# Patient Record
Sex: Male | Born: 1970 | Race: White | Hispanic: No | Marital: Single | Smoking: Current every day smoker
Health system: Southern US, Community
[De-identification: ages and names within clinical notes are randomized; demographics above are authoritative.]

## PROBLEM LIST (undated history)

## (undated) DIAGNOSIS — F1010 Alcohol abuse, uncomplicated: Secondary | ICD-10-CM

## (undated) DIAGNOSIS — G8929 Other chronic pain: Secondary | ICD-10-CM

## (undated) DIAGNOSIS — F32A Depression, unspecified: Secondary | ICD-10-CM

## (undated) DIAGNOSIS — G4700 Insomnia, unspecified: Secondary | ICD-10-CM

## (undated) DIAGNOSIS — F419 Anxiety disorder, unspecified: Secondary | ICD-10-CM

## (undated) DIAGNOSIS — F329 Major depressive disorder, single episode, unspecified: Secondary | ICD-10-CM

## (undated) DIAGNOSIS — F909 Attention-deficit hyperactivity disorder, unspecified type: Secondary | ICD-10-CM

## (undated) HISTORY — DX: Alcohol abuse, uncomplicated: F10.10

## (undated) HISTORY — DX: Insomnia, unspecified: G47.00

## (undated) HISTORY — DX: Anxiety disorder, unspecified: F41.9

## (undated) HISTORY — PX: OTHER SURGICAL HISTORY: SHX169

## (undated) HISTORY — DX: Other chronic pain: G89.29

---

## 2005-05-21 ENCOUNTER — Emergency Department (HOSPITAL_COMMUNITY): Admission: EM | Admit: 2005-05-21 | Discharge: 2005-05-21 | Payer: Self-pay | Admitting: Emergency Medicine

## 2007-03-30 ENCOUNTER — Ambulatory Visit: Payer: Self-pay | Admitting: Orthopedic Surgery

## 2007-03-30 ENCOUNTER — Emergency Department (HOSPITAL_COMMUNITY): Admission: EM | Admit: 2007-03-30 | Discharge: 2007-03-30 | Payer: Self-pay | Admitting: Emergency Medicine

## 2007-04-05 ENCOUNTER — Ambulatory Visit: Payer: Self-pay | Admitting: Orthopedic Surgery

## 2007-04-05 ENCOUNTER — Emergency Department (HOSPITAL_COMMUNITY): Admission: EM | Admit: 2007-04-05 | Discharge: 2007-04-05 | Payer: Self-pay | Admitting: Emergency Medicine

## 2007-04-12 ENCOUNTER — Encounter (INDEPENDENT_AMBULATORY_CARE_PROVIDER_SITE_OTHER): Payer: Self-pay | Admitting: *Deleted

## 2007-10-09 ENCOUNTER — Emergency Department (HOSPITAL_COMMUNITY): Admission: EM | Admit: 2007-10-09 | Discharge: 2007-10-09 | Payer: Self-pay | Admitting: Emergency Medicine

## 2010-04-15 ENCOUNTER — Emergency Department (HOSPITAL_COMMUNITY)
Admission: EM | Admit: 2010-04-15 | Discharge: 2010-04-15 | Payer: Self-pay | Source: Home / Self Care | Admitting: Emergency Medicine

## 2010-05-19 ENCOUNTER — Emergency Department (HOSPITAL_COMMUNITY)
Admission: EM | Admit: 2010-05-19 | Discharge: 2010-05-19 | Payer: Self-pay | Source: Home / Self Care | Admitting: Emergency Medicine

## 2010-06-06 ENCOUNTER — Ambulatory Visit (HOSPITAL_COMMUNITY)
Admission: RE | Admit: 2010-06-06 | Discharge: 2010-06-06 | Payer: Self-pay | Source: Home / Self Care | Attending: Otolaryngology | Admitting: Otolaryngology

## 2010-09-23 NOTE — Consult Note (Signed)
NAME:  John Bradley, PERREIRA NO.:  [id number]   MEDICAL RECORD NO.:  [id number]          PATIENT TYPE:  EMS   LOCATION:  ED                            FACILITY:  [facility]   PHYSICIAN:  Vickki Hearing, M.D.DATE OF BIRTH:  14-Feb-1971   DATE OF CONSULTATION:  03/30/2007  DATE OF DISCHARGE:  03/30/2007                                 CONSULTATION   This is a consultation in the emergency room requested by Dr. Margretta Ditty.   REASON FOR CONSULTATION:  Open fracture left index finger.   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  He was wearing some gloves.  The glove was  sucked into the wood cutter and drew his finger into the wood cutter,  and he presented with an open fracture and laceration with flexion  deformity of the digit at the middle phalanx.  He was evaluated in the  emergency room.  He has had a tetanus shot within the last 5 years.  His  x-ray shows a middle phalanx fracture at its proximal metastasis.  It is  transverse through and through.   Reportedly, he was neurologically and vascularly intact.  He had  flexion, weak extension.   HE DENIES ANY ALLERGIES.   He denies being on any medications.  He denies any major medical  problems.   His vital signs were stable.  His appearance was normal.  His vascular  examination of the digit showed good capillary refill, color and  temperature.  Sensory exam showed just some mild decreased sensation  that seemed to be global at the distal tip of the finger.  The mental  status was normal.  The patient was calm.  The patient had been given  some medications so his mood was flat.  The musculoskeletal exam showed  that this injury was isolated to the index finger.  There was a 85%  circumferential laceration which was down to the extensor tendon but not  through the extensor tendon.  There was crepitus at the fracture site.  The digit was obviously tender.  He did  have flexion.  He had weak  extension.   Radiographs show a metaphyseal fracture of the middle phalanx.   The patient was given a digital block with 1% plain lidocaine.  He was  then treated with irrigation, a gram of Ancef, a loose wound closure and  splint.   He is discharged on clindamycin 300 mg t.i.d. and Lorcet 10/650 #40 to  take one q.4 p.r.n. for pain.  Followup scheduled for Tuesday, November  25.      Vickki Hearing, M.D.  Electronically Signed     SEH/MEDQ  D:  03/30/2007  T:  03/31/2007  Job:  [id number]

## 2010-12-30 ENCOUNTER — Emergency Department (HOSPITAL_COMMUNITY)
Admission: EM | Admit: 2010-12-30 | Discharge: 2010-12-30 | Disposition: A | Discharge status: Home / Self Care | Payer: Medicaid Other | Attending: Emergency Medicine | Admitting: Emergency Medicine

## 2010-12-30 ENCOUNTER — Encounter: Payer: Self-pay | Admitting: *Deleted

## 2010-12-30 DIAGNOSIS — F329 Major depressive disorder, single episode, unspecified: Secondary | ICD-10-CM | POA: Insufficient documentation

## 2010-12-30 DIAGNOSIS — F3289 Other specified depressive episodes: Secondary | ICD-10-CM | POA: Insufficient documentation

## 2010-12-30 DIAGNOSIS — Z760 Encounter for issue of repeat prescription: Secondary | ICD-10-CM | POA: Insufficient documentation

## 2010-12-30 DIAGNOSIS — F17200 Nicotine dependence, unspecified, uncomplicated: Secondary | ICD-10-CM | POA: Insufficient documentation

## 2010-12-30 DIAGNOSIS — F909 Attention-deficit hyperactivity disorder, unspecified type: Secondary | ICD-10-CM | POA: Insufficient documentation

## 2010-12-30 HISTORY — DX: Depression, unspecified: F32.A

## 2010-12-30 HISTORY — DX: Major depressive disorder, single episode, unspecified: F32.9

## 2010-12-30 HISTORY — DX: Attention-deficit hyperactivity disorder, unspecified type: F90.9

## 2010-12-30 NOTE — ED Notes (Signed)
Patient ran out of med Celexa and wants a refill, wife states that they just recently moved here and that pt does not have a doctor yet

## 2010-12-30 NOTE — ED Provider Notes (Signed)
History     CSN: [id number] Arrival date & time: 12/30/2010  9:28 PM  Chief Complaint  Patient presents with  . Illegal value: [    ran out of med-celexa   HPI Comments: Seen 2210. Pateint here with request for refill for his celexa. He is here visiting family and is out of his medicine. He wants his celexa refilled.It is normally filled at [facility]. He has not called his PMD. I advised him to contact his PMD tomorrow and prescription can be filled at the local [facility].  The history is provided by the patient.    Past Medical History  Diagnosis Date  . Depression   . ADHD (attention deficit hyperactivity disorder)     History reviewed. No pertinent past surgical history.  History reviewed. No pertinent family history.  History  Substance Use Topics  . Smoking status: Current Everyday Smoker -- 0.5 packs/day    Types: Cigarettes  . Smokeless tobacco: Not on file  . Alcohol Use: No      Review of Systems  All other systems reviewed and are negative.    Physical Exam  BP 113/67  Pulse 78  Temp(Src) 98 F (36.7 C) (Oral)  Resp 18  Ht 6\' 2"  (1.88 m)  Wt 185 lb (83.915 kg)  BMI 23.75 kg/m2  SpO2 97%  Physical Exam  Nursing note and vitals reviewed. Constitutional: He is oriented to person, place, and time. He appears well-developed and well-nourished.  HENT:  Head: Normocephalic.  Right Ear: External ear normal.  Left Ear: External ear normal.  Eyes: EOM are normal. Pupils are equal, round, and reactive to light.  Neck: Neck supple.  Cardiovascular: Normal rate, normal heart sounds and intact distal pulses.   Pulmonary/Chest: Effort normal and breath sounds normal.  Abdominal: Soft.  Musculoskeletal: Normal range of motion.  Neurological: He is alert and oriented to person, place, and time.  Skin: Skin is warm and dry.    ED Course  Procedures  MDM Patient visiting and ran out of celexa. He has access to his PMD but had not contacted him. Prescription  was not renewed. He was advised to follow up with his doctor.Reviewed nurse notes and vital signs.      Nicoletta Dress. Colon Branch, MD 12/30/10 2340

## 2011-01-20 ENCOUNTER — Ambulatory Visit: Payer: Medicaid Other | Admitting: Family Medicine

## 2011-01-23 ENCOUNTER — Encounter: Payer: Self-pay | Admitting: Family Medicine

## 2011-01-23 ENCOUNTER — Ambulatory Visit (INDEPENDENT_AMBULATORY_CARE_PROVIDER_SITE_OTHER): Payer: Medicare Other | Admitting: Family Medicine

## 2011-01-23 VITALS — BP 114/74 | HR 77 | Resp 16 | Ht 74.0 in | Wt 203.8 lb

## 2011-01-23 DIAGNOSIS — F1021 Alcohol dependence, in remission: Secondary | ICD-10-CM

## 2011-01-23 DIAGNOSIS — F3289 Other specified depressive episodes: Secondary | ICD-10-CM

## 2011-01-23 DIAGNOSIS — G8929 Other chronic pain: Secondary | ICD-10-CM

## 2011-01-23 DIAGNOSIS — M549 Dorsalgia, unspecified: Secondary | ICD-10-CM

## 2011-01-23 DIAGNOSIS — F32A Depression, unspecified: Secondary | ICD-10-CM | POA: Insufficient documentation

## 2011-01-23 DIAGNOSIS — F329 Major depressive disorder, single episode, unspecified: Secondary | ICD-10-CM

## 2011-01-23 DIAGNOSIS — M542 Cervicalgia: Secondary | ICD-10-CM

## 2011-01-23 MED ORDER — CITALOPRAM HYDROBROMIDE 40 MG PO TABS: 60.0000 mg | ORAL_TABLET | Freq: Every day | ORAL | Status: DC

## 2011-01-23 MED ORDER — RANITIDINE HCL 150 MG PO TABS: 150.0000 mg | ORAL_TABLET | Freq: Every day | ORAL | Status: DC

## 2011-01-23 MED ORDER — MELOXICAM 7.5 MG PO TABS: 7.5000 mg | ORAL_TABLET | Freq: Every day | ORAL | Status: DC

## 2011-01-23 MED ORDER — CYCLOBENZAPRINE HCL 10 MG PO TABS: 10.0000 mg | ORAL_TABLET | Freq: Three times a day (TID) | ORAL | Status: DC | PRN

## 2011-01-23 NOTE — Assessment & Plan Note (Signed)
Per above, obtain imaging, no red flags

## 2011-01-23 NOTE — Assessment & Plan Note (Signed)
His neck and back pain appeared chronic problems. I will obtain plain imaging. Advised I would not prescribe any narcotic medications as I do not have the culprit of this problem. Will start on muscle relaxants and anti-inflammatory daily

## 2011-01-23 NOTE — Patient Instructions (Signed)
For your back and neck get the x-rays done in the next 2 weeks  Start the meloxicam daily and take the muscle relaxant I will get records from your previous doctor Return in 4 weeks

## 2011-01-23 NOTE — Assessment & Plan Note (Signed)
Patient to continue Celexa, I will obtain medical records, I advised him I do not feel comfortable prescribing something for sleep at this time especially with other new medications

## 2011-01-23 NOTE — Assessment & Plan Note (Signed)
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 I am concerned with his mood disorder that this may not be the case
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
## 2011-02-12 ENCOUNTER — Ambulatory Visit (HOSPITAL_COMMUNITY)
Admission: RE | Admit: 2011-02-12 | Discharge: 2011-02-12 | Disposition: A | Discharge status: Home / Self Care | Payer: Medicaid Other | Source: Ambulatory Visit | Attending: Family Medicine | Admitting: Family Medicine

## 2011-02-12 DIAGNOSIS — M79609 Pain in unspecified limb: Secondary | ICD-10-CM | POA: Insufficient documentation

## 2011-02-12 DIAGNOSIS — M545 Low back pain, unspecified: Secondary | ICD-10-CM | POA: Insufficient documentation

## 2011-02-12 DIAGNOSIS — M51379 Other intervertebral disc degeneration, lumbosacral region without mention of lumbar back pain or lower extremity pain: Secondary | ICD-10-CM | POA: Insufficient documentation

## 2011-02-12 DIAGNOSIS — M5137 Other intervertebral disc degeneration, lumbosacral region: Secondary | ICD-10-CM | POA: Insufficient documentation

## 2011-02-12 DIAGNOSIS — G8929 Other chronic pain: Secondary | ICD-10-CM

## 2011-02-12 DIAGNOSIS — M542 Cervicalgia: Secondary | ICD-10-CM | POA: Insufficient documentation

## 2011-02-23 ENCOUNTER — Encounter: Payer: Self-pay | Admitting: Family Medicine

## 2011-02-23 ENCOUNTER — Ambulatory Visit (INDEPENDENT_AMBULATORY_CARE_PROVIDER_SITE_OTHER): Payer: Medicaid Other | Admitting: Family Medicine

## 2011-02-23 VITALS — BP 116/70 | HR 92 | Resp 16 | Wt 206.0 lb

## 2011-02-23 DIAGNOSIS — Z23 Encounter for immunization: Secondary | ICD-10-CM

## 2011-02-23 DIAGNOSIS — M542 Cervicalgia: Secondary | ICD-10-CM

## 2011-02-23 DIAGNOSIS — T300 Burn of unspecified body region, unspecified degree: Secondary | ICD-10-CM

## 2011-02-23 DIAGNOSIS — F32A Depression, unspecified: Secondary | ICD-10-CM

## 2011-02-23 DIAGNOSIS — F3289 Other specified depressive episodes: Secondary | ICD-10-CM

## 2011-02-23 DIAGNOSIS — F329 Major depressive disorder, single episode, unspecified: Secondary | ICD-10-CM

## 2011-02-23 DIAGNOSIS — X19XXXA Contact with other heat and hot substances, initial encounter: Secondary | ICD-10-CM

## 2011-02-23 DIAGNOSIS — M549 Dorsalgia, unspecified: Secondary | ICD-10-CM

## 2011-02-23 DIAGNOSIS — G8929 Other chronic pain: Secondary | ICD-10-CM

## 2011-02-23 DIAGNOSIS — T22019A Burn of unspecified degree of unspecified forearm, initial encounter: Secondary | ICD-10-CM

## 2011-02-23 MED ORDER — NICOTINE 14 MG/24HR TD PT24: 1.0000 | MEDICATED_PATCH | TRANSDERMAL | Status: DC

## 2011-02-23 MED ORDER — CITALOPRAM HYDROBROMIDE 40 MG PO TABS: 60.0000 mg | ORAL_TABLET | Freq: Every day | ORAL | Status: DC

## 2011-02-23 MED ORDER — CYCLOBENZAPRINE HCL 10 MG PO TABS: 10.0000 mg | ORAL_TABLET | Freq: Three times a day (TID) | ORAL | Status: AC | PRN

## 2011-02-23 MED ORDER — RANITIDINE HCL 150 MG PO TABS: 150.0000 mg | ORAL_TABLET | Freq: Every day | ORAL | Status: DC

## 2011-02-23 MED ORDER — SILVER SULFADIAZINE 1 % EX CREA: TOPICAL_CREAM | CUTANEOUS | Status: AC

## 2011-02-23 NOTE — Patient Instructions (Signed)
Use the cream for the burn, use twice a day- if you notice blistering or large amounts of skin peeling off, come back to be seen Continue the muscle relaxant as needed for your neck/back Continue the Celexa Try the nicotine patch, use 1 patch daily, do not smoke with the patch on F/u in 6 months for a physical-do not eat after midnight- give him a morning appt
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
## 2011-02-24 NOTE — Assessment & Plan Note (Signed)
Chronic in nature, fairly normal x-ray. No chronic narcotics needed

## 2011-02-24 NOTE — Assessment & Plan Note (Signed)
Only spasm noted, discussed no chronic narotics needed. Refilled flexeril

## 2011-02-24 NOTE — Assessment & Plan Note (Signed)
Appears stable today, no change in meds

## 2011-02-24 NOTE — Assessment & Plan Note (Signed)
Given red flags Silvadene to be used

## 2011-08-24 ENCOUNTER — Encounter: Payer: Medicaid Other | Admitting: Family Medicine

## 2011-08-24 ENCOUNTER — Encounter: Payer: Self-pay | Admitting: Family Medicine

## 2013-09-19 ENCOUNTER — Emergency Department (HOSPITAL_COMMUNITY): Payer: Medicaid Other

## 2013-09-19 ENCOUNTER — Emergency Department (HOSPITAL_COMMUNITY)
Admission: EM | Admit: 2013-09-19 | Discharge: 2013-09-19 | Disposition: A | Discharge status: Home / Self Care | Payer: Medicaid Other | Attending: Emergency Medicine | Admitting: Emergency Medicine

## 2013-09-19 ENCOUNTER — Encounter (HOSPITAL_COMMUNITY): Payer: Self-pay | Admitting: Emergency Medicine

## 2013-09-19 DIAGNOSIS — G8929 Other chronic pain: Secondary | ICD-10-CM | POA: Insufficient documentation

## 2013-09-19 DIAGNOSIS — Y92838 Other recreation area as the place of occurrence of the external cause: Secondary | ICD-10-CM

## 2013-09-19 DIAGNOSIS — F329 Major depressive disorder, single episode, unspecified: Secondary | ICD-10-CM | POA: Insufficient documentation

## 2013-09-19 DIAGNOSIS — S300XXA Contusion of lower back and pelvis, initial encounter: Secondary | ICD-10-CM

## 2013-09-19 DIAGNOSIS — F17200 Nicotine dependence, unspecified, uncomplicated: Secondary | ICD-10-CM | POA: Insufficient documentation

## 2013-09-19 DIAGNOSIS — F3289 Other specified depressive episodes: Secondary | ICD-10-CM | POA: Insufficient documentation

## 2013-09-19 DIAGNOSIS — Y9239 Other specified sports and athletic area as the place of occurrence of the external cause: Secondary | ICD-10-CM | POA: Insufficient documentation

## 2013-09-19 DIAGNOSIS — S20229A Contusion of unspecified back wall of thorax, initial encounter: Secondary | ICD-10-CM | POA: Insufficient documentation

## 2013-09-19 DIAGNOSIS — Z79899 Other long term (current) drug therapy: Secondary | ICD-10-CM | POA: Insufficient documentation

## 2013-09-19 DIAGNOSIS — Y9351 Activity, roller skating (inline) and skateboarding: Secondary | ICD-10-CM | POA: Insufficient documentation

## 2013-09-19 MED ORDER — TRAMADOL HCL 50 MG PO TABS: 50.0000 mg | ORAL_TABLET | Freq: Four times a day (QID) | ORAL | Status: DC | PRN

## 2013-09-19 NOTE — ED Notes (Signed)
Pt c/o tailbone pain since falling skating April 26th

## 2013-09-19 NOTE — Discharge Instructions (Signed)
Follow up with your md as needed °

## 2013-09-19 NOTE — ED Provider Notes (Signed)
CSN: [id number]     Arrival date & time 09/19/13  0019 History   First MD Initiated Contact with Patient 09/19/13 0157     Chief Complaint  Patient presents with  . Tailbone Pain     (Consider location/radiation/quality/duration/timing/severity/associated sxs/prior Treatment) Patient is a 43 y.o. male presenting with fall. The history is provided by the patient (pt fell skating and has pain in his buttocks).  Fall This is a new problem. The current episode started 2 days ago. The problem occurs constantly. The problem has not changed since onset.Pertinent negatives include no chest pain, no abdominal pain and no headaches. The symptoms are aggravated by walking. Nothing relieves the symptoms.    Past Medical History  Diagnosis Date  . Depression   . ADHD (attention deficit hyperactivity disorder)   . Chronic pain   . Insomnia   . Alcohol abuse    Past Surgical History  Procedure Laterality Date  . Left ear      tube   Family History  Problem Relation Age of Onset  . Diabetes Mother    History  Substance Use Topics  . Smoking status: Current Every Day Smoker -- 0.50 packs/day    Types: Cigarettes  . Smokeless tobacco: Not on file  . Alcohol Use: No    Review of Systems  Constitutional: Negative for appetite change and fatigue.  HENT: Negative for congestion, ear discharge and sinus pressure.   Eyes: Negative for discharge.  Respiratory: Negative for cough.   Cardiovascular: Negative for chest pain.  Gastrointestinal: Negative for abdominal pain and diarrhea.  Genitourinary: Negative for frequency and hematuria.  Musculoskeletal: Positive for back pain.  Skin: Negative for rash.  Neurological: Negative for seizures and headaches.  Psychiatric/Behavioral: Negative for hallucinations.      Allergies  Review of patient's allergies indicates no known allergies.  Home Medications   Prior to Admission medications   Medication Sig Start Date End Date Taking?  Authorizing Provider  citalopram (CELEXA) 40 MG tablet Take 1.5 tablets (60 mg total) by mouth daily. 02/23/11  Yes Salley ScarletKawanta F [place], MD  ranitidine (ZANTAC) 150 MG tablet Take 1 tablet (150 mg total) by mouth at bedtime. 02/23/11  Yes Salley ScarletKawanta F [place], MD  nicotine (NICODERM CQ - DOSED IN MG/24 HOURS) 14 mg/24hr patch Place 1 patch onto the skin daily. 02/23/11   Salley ScarletKawanta F , MD  traMADol (ULTRAM) 50 MG tablet Take 1 tablet (50 mg total) by mouth every 6 (six) hours as needed. 09/19/13   Benny LennertJoseph L Yuktha Kerchner, MD   BP 111/82  Pulse 74  Temp(Src) 97.6 F (36.4 C) (Oral)  Resp 18  Ht 6\' 2"  (1.88 m)  Wt 218 lb (98.884 kg)  BMI 27.98 kg/m2  SpO2 98% Physical Exam  Constitutional: He is oriented to person, place, and time. He appears well-developed.  HENT:  Head: Normocephalic.  Eyes: Conjunctivae and EOM are normal. No scleral icterus.  Neck: Neck supple. No thyromegaly present.  Cardiovascular: Normal rate and regular rhythm.  Exam reveals no gallop and no friction rub.   No murmur heard. Pulmonary/Chest: No stridor. He has no wheezes. He has no rales. He exhibits no tenderness.  Abdominal: He exhibits no distension. There is no tenderness. There is no rebound.  Musculoskeletal: Normal range of motion. He exhibits no edema.  Tender lumbar spine  Lymphadenopathy:    He has no cervical adenopathy.  Neurological: He is oriented to person, place, and time. He exhibits normal muscle tone. Coordination normal.  Skin: No rash noted. No erythema.  Psychiatric: He has a normal mood and affect. His behavior is normal.    ED Course  Procedures (including critical care time) Labs Review Labs Reviewed - No data to display  Imaging Review Dg Sacrum/coccyx  09/19/2013   CLINICAL DATA:  TAILBONE PAIN  EXAM: SACRUM AND COCCYX - 2+ VIEW  COMPARISON:  DG LUMBAR SPINE 2-3 VIEWS dated 02/12/2011  FINDINGS: No fracture deformity or malalignment. No destructive bony lesions. Noticed abnormal foraminal  expansion. Symmetric appearance of sacroiliac joints.  IMPRESSION: Negative.   Electronically Signed   By: Awilda Metroourtnay  Bloomer   On: 09/19/2013 02:23     EKG Interpretation None      MDM   Final diagnoses:  Lumbar contusion        Benny LennertJoseph L Elizandro Laura, MD 09/19/13 [phone]

## 2014-10-30 ENCOUNTER — Emergency Department (HOSPITAL_COMMUNITY)
Admission: EM | Admit: 2014-10-30 | Discharge: 2014-10-30 | Disposition: A | Discharge status: Home / Self Care | Payer: Medicare Other | Attending: Emergency Medicine | Admitting: Emergency Medicine

## 2014-10-30 ENCOUNTER — Encounter (HOSPITAL_COMMUNITY): Payer: Self-pay | Admitting: *Deleted

## 2014-10-30 ENCOUNTER — Emergency Department (HOSPITAL_COMMUNITY): Payer: Medicare Other

## 2014-10-30 DIAGNOSIS — Z720 Tobacco use: Secondary | ICD-10-CM | POA: Insufficient documentation

## 2014-10-30 DIAGNOSIS — G8929 Other chronic pain: Secondary | ICD-10-CM | POA: Insufficient documentation

## 2014-10-30 DIAGNOSIS — Y998 Other external cause status: Secondary | ICD-10-CM | POA: Insufficient documentation

## 2014-10-30 DIAGNOSIS — Z79899 Other long term (current) drug therapy: Secondary | ICD-10-CM | POA: Diagnosis not present

## 2014-10-30 DIAGNOSIS — S51811A Laceration without foreign body of right forearm, initial encounter: Secondary | ICD-10-CM | POA: Insufficient documentation

## 2014-10-30 DIAGNOSIS — F329 Major depressive disorder, single episode, unspecified: Secondary | ICD-10-CM | POA: Insufficient documentation

## 2014-10-30 DIAGNOSIS — Z8669 Personal history of other diseases of the nervous system and sense organs: Secondary | ICD-10-CM | POA: Diagnosis not present

## 2014-10-30 DIAGNOSIS — W293XXA Contact with powered garden and outdoor hand tools and machinery, initial encounter: Secondary | ICD-10-CM | POA: Insufficient documentation

## 2014-10-30 DIAGNOSIS — Y9289 Other specified places as the place of occurrence of the external cause: Secondary | ICD-10-CM | POA: Diagnosis not present

## 2014-10-30 DIAGNOSIS — Y9389 Activity, other specified: Secondary | ICD-10-CM | POA: Insufficient documentation

## 2014-10-30 LAB — COMPREHENSIVE METABOLIC PANEL
ALBUMIN: 4.1 g/dL (ref 3.5–5.0)
ALK PHOS: 105 U/L (ref 38–126)
ALT: 22 U/L (ref 17–63)
ANION GAP: 10 (ref 5–15)
AST: 25 U/L (ref 15–41)
BUN: 12 mg/dL (ref 6–20)
CALCIUM: 8.9 mg/dL (ref 8.9–10.3)
CO2: 22 mmol/L (ref 22–32)
Chloride: 107 mmol/L (ref 101–111)
Creatinine, Ser: 1.06 mg/dL (ref 0.61–1.24)
GFR calc Af Amer: >60 mL/min (ref >60)
GFR calc non Af Amer: >60 mL/min (ref >60)
Glucose, Bld: 125 mg/dL — ABNORMAL HIGH (ref 65–99)
POTASSIUM: 3.5 mmol/L (ref 3.5–5.1)
SODIUM: 139 mmol/L (ref 135–145)
TOTAL PROTEIN: 7.8 g/dL (ref 6.5–8.1)
Total Bilirubin: 0.9 mg/dL (ref 0.3–1.2)

## 2014-10-30 LAB — CBC WITH DIFFERENTIAL/PLATELET
BASOS PCT: 0 % (ref 0–1)
Basophils Absolute: 0 10*3/uL (ref 0.0–0.1)
Eosinophils Absolute: 0.1 10*3/uL (ref 0.0–0.7)
Eosinophils Relative: 1 % (ref 0–5)
HEMATOCRIT: 49 % (ref 39.0–52.0)
HEMOGLOBIN: 17.9 g/dL — AB (ref 13.0–17.0)
LYMPHS ABS: 2.7 10*3/uL (ref 0.7–4.0)
Lymphocytes Relative: 35 % (ref 12–46)
MCH: 33 pg (ref 26.0–34.0)
MCHC: 36.5 g/dL — AB (ref 30.0–36.0)
MCV: 90.2 fL (ref 78.0–100.0)
MONO ABS: 0.7 10*3/uL (ref 0.1–1.0)
MONOS PCT: 9 % (ref 3–12)
NEUTROS ABS: 4.1 10*3/uL (ref 1.7–7.7)
Neutrophils Relative %: 55 % (ref 43–77)
Platelets: 203 10*3/uL (ref 150–400)
RBC: 5.43 MIL/uL (ref 4.22–5.81)
RDW: 12.3 % (ref 11.5–15.5)
WBC: 7.5 10*3/uL (ref 4.0–10.5)

## 2014-10-30 MED ORDER — MORPHINE SULFATE 4 MG/ML IJ SOLN: 4.0000 mg | INTRAMUSCULAR | Status: DC | PRN

## 2014-10-30 MED ORDER — SODIUM CHLORIDE 0.9 % IV SOLN
1000.0000 mL | Freq: Once | INTRAVENOUS | Status: AC
  Administered 2014-10-30: 1000 mL via INTRAVENOUS

## 2014-10-30 MED ORDER — CEPHALEXIN 500 MG PO CAPS: 500.0000 mg | ORAL_CAPSULE | Freq: Four times a day (QID) | ORAL | Status: DC

## 2014-10-30 MED ORDER — CEFAZOLIN SODIUM 1-5 GM-% IV SOLN
INTRAVENOUS | Status: AC
  Administered 2014-10-30: 1000 mg via INTRAVENOUS
  Filled 2014-10-30: qty 50

## 2014-10-30 MED ORDER — CEFAZOLIN SODIUM 1-5 GM-% IV SOLN
1.0000 g | Freq: Once | INTRAVENOUS | Status: AC
  Administered 2014-10-30: 1000 mg via INTRAVENOUS

## 2014-10-30 MED ORDER — LIDOCAINE-EPINEPHRINE (PF) 1 %-1:200000 IJ SOLN
INTRAMUSCULAR | Status: AC
  Filled 2014-10-30: qty 10

## 2014-10-30 MED ORDER — OXYCODONE-ACETAMINOPHEN 5-325 MG PO TABS: 1.0000 | ORAL_TABLET | Freq: Four times a day (QID) | ORAL | Status: DC | PRN

## 2014-10-30 NOTE — ED Notes (Signed)
Awaiting discharge papers at this time. Coke and crackers given.

## 2014-10-30 NOTE — ED Notes (Signed)
Laceration r/t chainsaw injury to right posterior forearm. Trauma tourniquet applied resulting in hemorrhage control. Patient alert/oriented. MD at bedside for laceration repair.

## 2014-10-30 NOTE — ED Notes (Signed)
Pt has chainsaw injury to rt arm, bleeding profusely.

## 2014-10-30 NOTE — ED Notes (Addendum)
Holding pressure to right forearm.  Tourniquet  being applied.   ED physician at bedside.

## 2014-10-30 NOTE — Discharge Instructions (Signed)
Elevate arm. Keep dressing intact. Do not change dressing until recheck. Recheck in 2 days either here or with Dr. Amanda Pea.   You will need to call their office for an appointment. Prescriptions for pain medicine and antibiotic. Return for any other concerns.

## 2014-10-30 NOTE — ED Provider Notes (Signed)
CSN: [id number]     Arrival date & time 10/30/14  1452 History   First MD Initiated Contact with Patient 10/30/14 1510     Chief Complaint  Patient presents with  . chainsaw injury      (Consider location/radiation/quality/duration/timing/severity/associated sxs/prior Treatment) HPI..... Level V caveat for urgent need for intervention. Patient is status post accidental chainsaw laceration to right forearm  with a copious amount of bleeding. Accident happened short time ago. He was transported by EMS. Pressure and tourniquet were applied by EMS.  Patient is able to move his hand  Past Medical History  Diagnosis Date  . Depression   . ADHD (attention deficit hyperactivity disorder)   . Chronic pain   . Insomnia   . Alcohol abuse    Past Surgical History  Procedure Laterality Date  . Left ear      tube   Family History  Problem Relation Age of Onset  . Diabetes Mother    History  Substance Use Topics  . Smoking status: Current Every Day Smoker -- 0.50 packs/day    Types: Cigarettes  . Smokeless tobacco: Not on file  . Alcohol Use: No    Review of Systems  Unable to perform ROS: Acuity of condition      Allergies  Review of patient's allergies indicates no known allergies.  Home Medications   Prior to Admission medications   Medication Sig Start Date End Date Taking? Authorizing Provider  citalopram (CELEXA) 40 MG tablet Take 1.5 tablets (60 mg total) by mouth daily. 02/23/11  Yes Salley Scarlet, MD  cephALEXin (KEFLEX) 500 MG capsule Take 1 capsule (500 mg total) by mouth 4 (four) times daily. 10/30/14   Donnetta Hutching, MD  oxyCODONE-acetaminophen (PERCOCET/ROXICET) 5-325 MG per tablet Take 1-2 tablets by mouth every 6 (six) hours as needed. 10/30/14   Donnetta Hutching, MD  traMADol (ULTRAM) 50 MG tablet Take 1 tablet (50 mg total) by mouth every 6 (six) hours as needed. Patient not taking: Reported on 10/30/2014 09/19/13   Bethann Berkshire, MD   BP 118/42 mmHg  Pulse 75   Temp(Src) 98.1 F (36.7 C) (Oral)  Resp 17  Ht 6\' 2"  (1.88 m)  Wt 210 lb (95.255 kg)  BMI 26.95 kg/m2  SpO2 100% Physical Exam  Constitutional: He is oriented to person, place, and time. He appears well-developed and well-nourished.  HENT:  Head: Normocephalic and atraumatic.  Eyes: Conjunctivae and EOM are normal. Pupils are equal, round, and reactive to light.  Neck: Normal range of motion. Neck supple.  Cardiovascular: Normal rate and regular rhythm.   Pulmonary/Chest: Effort normal and breath sounds normal.  Abdominal: Soft. Bowel sounds are normal.  Musculoskeletal:  Right forearm: Jagged irregular 3.5 cm laceration distal posterior medial forearm with significant bleeding.  Full range of motion of hand.  Neurological: He is alert and oriented to person, place, and time.  Skin: Skin is warm and dry.  Psychiatric: He has a normal mood and affect. His behavior is normal.  Nursing note and vitals reviewed.   ED Course  LACERATION REPAIR Date/Time: 10/30/2014 5:47 PM Performed by: Donnetta Hutching Authorized by: Donnetta Hutching Consent: The procedure was performed in an emergent situation. Verbal consent obtained. Risks and benefits: risks, benefits and alternatives were discussed Consent given by: patient Patient understanding: patient states understanding of the procedure being performed Relevant documents: relevant documents present and verified Imaging studies: imaging studies available Patient identity confirmed: verbally with patient Body area: upper extremity Location details: right  lower arm Laceration length: 3.5 cm Contamination: The wound is contaminated. Tendon involvement: none Nerve involvement: superficial Vascular damage: no Anesthesia: local infiltration Local anesthetic: lidocaine 1% with epinephrine Anesthetic total: 10 ml Patient sedated: no Preparation: Patient was prepped and draped in the usual sterile fashion. Irrigation solution: saline Irrigation  method: syringe Amount of cleaning: extensive Debridement: moderate Skin closure: 3-0 Prolene Number of sutures: 5 Technique: simple Approximation: loose Approximation difficulty: simple Dressing: gauze roll and antibiotic ointment Patient tolerance: Patient tolerated the procedure well with no immediate complications Comments: 10% of muscle belly of flexor carpi ulnaris severed.  Patient has full range of motion of his forearm and hand. Good hemostasis noted after procedure   (including critical care time) Labs Review Labs Reviewed  CBC WITH DIFFERENTIAL/PLATELET - Abnormal; Notable for the following:    Hemoglobin 17.9 (*)    MCHC 36.5 (*)    All other components within normal limits  COMPREHENSIVE METABOLIC PANEL - Abnormal; Notable for the following:    Glucose, Bld 125 (*)    All other components within normal limits    Imaging Review Dg Forearm Right  10/30/2014   CLINICAL DATA:  Patient cut with chain saw  EXAM: RIGHT FOREARM - 2 VIEW  COMPARISON:  None.  FINDINGS: Frontal and lateral views were obtained. A bandage overlies the distal forearm. There is soft tissue injury medially. Beyond the bandage, there is no radiopaque foreign body. No fracture or dislocation. Joint spaces appear intact.  IMPRESSION: No fracture or dislocation. No appreciable arthropathy. No radiopaque foreign body except for overlying bandage.   Electronically Signed   By: Bretta Bang III M.D.   On: 10/30/2014 16:17     EKG Interpretation None     CRITICAL CARE Performed by: Donnetta Hutching Total critical care time: 45 Critical care time was exclusive of separately billable procedures and treating other patients. Critical care was necessary to treat or prevent imminent or life-threatening deterioration. Critical care was time spent personally by me on the following activities: development of treatment plan with patient and/or surrogate as well as nursing, discussions with consultants, evaluation of  patient's response to treatment, examination of patient, obtaining history from patient or surrogate, ordering and performing treatments and interventions, ordering and review of laboratory studies, ordering and review of radiographic studies, pulse oximetry and re-evaluation of patient's condition. MDM   Final diagnoses:  Forearm laceration, right, initial encounter    Wound emergently closed as noted above.   Discussed clinical findings with hand surgeon on-call Dr Amanda Pea.  He will follow-up with patient in 2 days. Discharge instructions discussed with patient and his mother. He will return sooner for any concerns. Discharge medications Keflex 500 mg and Percocet.    Donnetta Hutching, MD 10/30/14 [phone]

## 2014-10-30 NOTE — ED Notes (Signed)
Patient transported to X-ray 

## 2014-10-30 NOTE — ED Notes (Signed)
tourniquet remain on.  Dr. Adriana Simas preparing to suture up.

## 2014-10-30 NOTE — ED Notes (Signed)
Bleeding remains controlled. Dressing remains clean/dry/intact.

## 2016-11-12 IMAGING — DX DG FOREARM 2V*R*
2 series · 2 of 2 positions shown · non-contrast
Comparison: None.

CLINICAL DATA: Patient cut with chain saw

EXAM:
RIGHT FOREARM - 2 VIEW

[forearm ap]
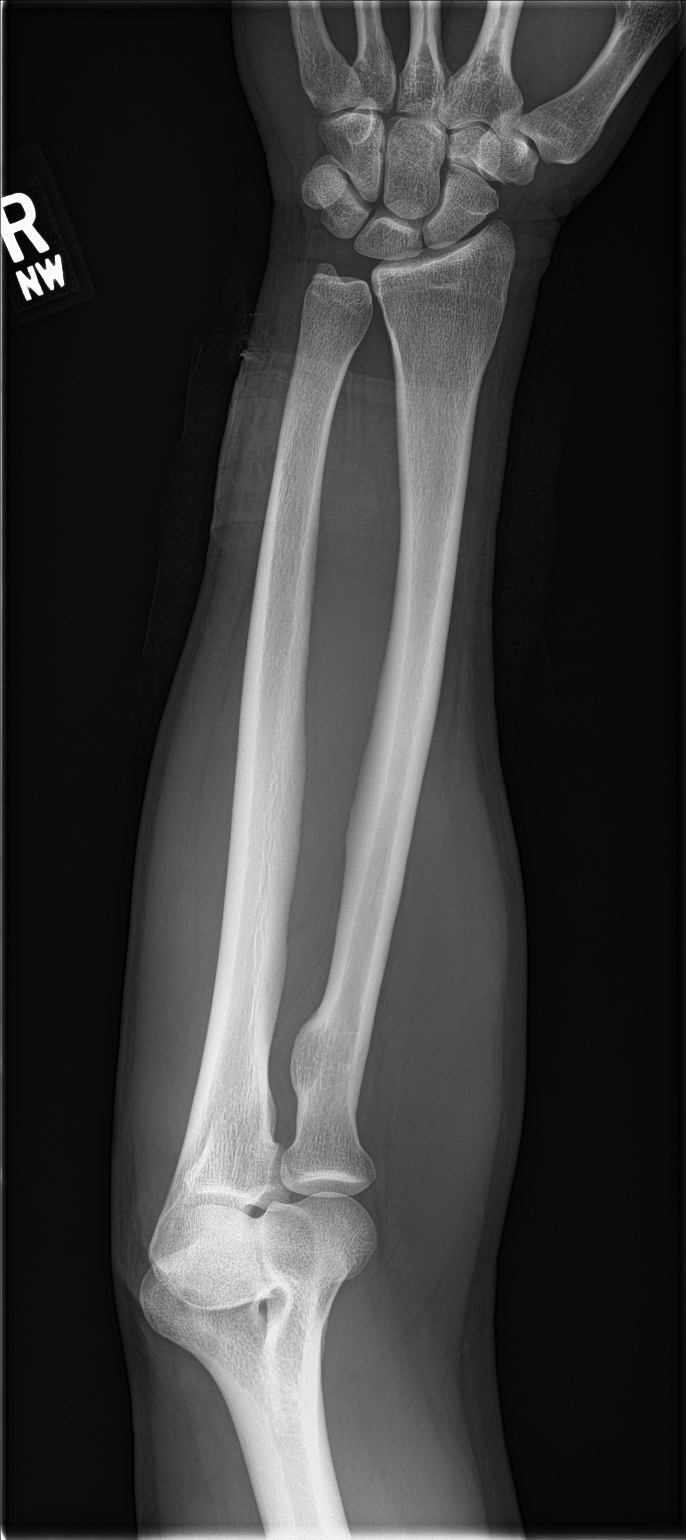

[forearm lat]
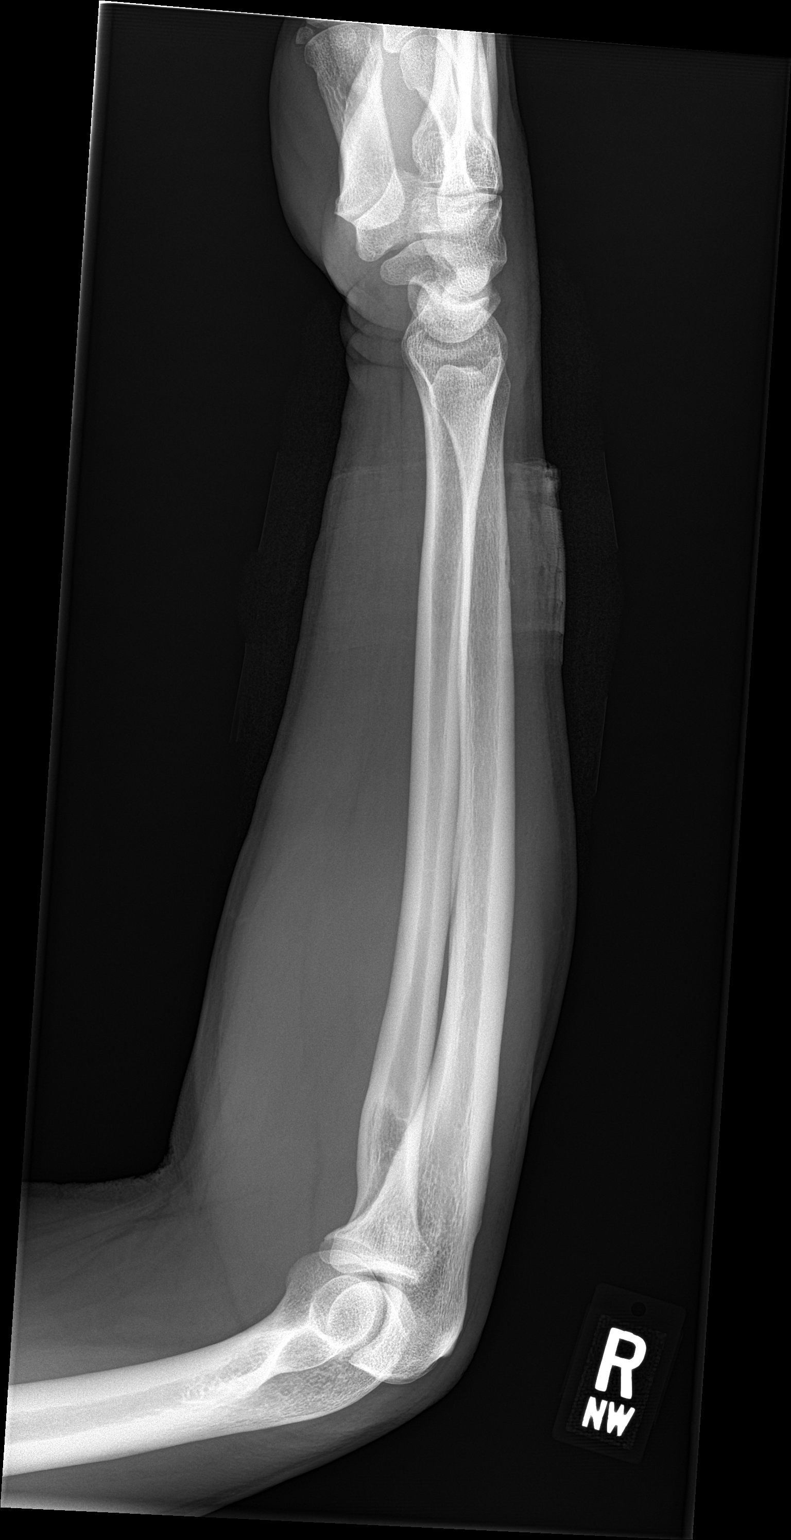

[2 of 2 positions shown; findings below may reference images not displayed]

FINDINGS: Frontal and lateral views were obtained. A bandage overlies the
distal forearm. There is soft tissue injury medially. Beyond the
bandage, there is no radiopaque foreign body. No fracture or
dislocation. Joint spaces appear intact.
IMPRESSION: No fracture or dislocation. No appreciable arthropathy. No
radiopaque foreign body except for overlying bandage.

## 2017-03-01 ENCOUNTER — Encounter (INDEPENDENT_AMBULATORY_CARE_PROVIDER_SITE_OTHER): Payer: Self-pay

## 2017-03-01 ENCOUNTER — Other Ambulatory Visit (HOSPITAL_COMMUNITY)
Admission: RE | Admit: 2017-03-01 | Discharge: 2017-03-01 | Disposition: A | Discharge status: Home / Self Care | Payer: Medicare Other | Source: Ambulatory Visit | Attending: Family Medicine | Admitting: Family Medicine

## 2017-03-01 ENCOUNTER — Encounter: Payer: Self-pay | Admitting: Family Medicine

## 2017-03-01 ENCOUNTER — Ambulatory Visit (INDEPENDENT_AMBULATORY_CARE_PROVIDER_SITE_OTHER): Payer: Medicare Other | Admitting: Family Medicine

## 2017-03-01 VITALS — BP 110/68 | HR 76 | Temp 98.5°F | Resp 16 | Ht 74.0 in | Wt 205.0 lb

## 2017-03-01 DIAGNOSIS — Z720 Tobacco use: Secondary | ICD-10-CM | POA: Insufficient documentation

## 2017-03-01 DIAGNOSIS — F909 Attention-deficit hyperactivity disorder, unspecified type: Secondary | ICD-10-CM | POA: Insufficient documentation

## 2017-03-01 DIAGNOSIS — Z202 Contact with and (suspected) exposure to infections with a predominantly sexual mode of transmission: Secondary | ICD-10-CM

## 2017-03-01 DIAGNOSIS — Z8342 Family history of familial hypercholesterolemia: Secondary | ICD-10-CM

## 2017-03-01 DIAGNOSIS — Z23 Encounter for immunization: Secondary | ICD-10-CM

## 2017-03-01 DIAGNOSIS — G8929 Other chronic pain: Secondary | ICD-10-CM | POA: Insufficient documentation

## 2017-03-01 DIAGNOSIS — Z79899 Other long term (current) drug therapy: Secondary | ICD-10-CM | POA: Insufficient documentation

## 2017-03-01 DIAGNOSIS — Z8379 Family history of other diseases of the digestive system: Secondary | ICD-10-CM | POA: Diagnosis not present

## 2017-03-01 DIAGNOSIS — F341 Dysthymic disorder: Secondary | ICD-10-CM | POA: Diagnosis not present

## 2017-03-01 DIAGNOSIS — F419 Anxiety disorder, unspecified: Secondary | ICD-10-CM | POA: Insufficient documentation

## 2017-03-01 DIAGNOSIS — Z8261 Family history of arthritis: Secondary | ICD-10-CM | POA: Diagnosis not present

## 2017-03-01 DIAGNOSIS — Z8249 Family history of ischemic heart disease and other diseases of the circulatory system: Secondary | ICD-10-CM | POA: Diagnosis not present

## 2017-03-01 DIAGNOSIS — F329 Major depressive disorder, single episode, unspecified: Secondary | ICD-10-CM | POA: Insufficient documentation

## 2017-03-01 DIAGNOSIS — M545 Low back pain: Secondary | ICD-10-CM | POA: Diagnosis not present

## 2017-03-01 DIAGNOSIS — F17210 Nicotine dependence, cigarettes, uncomplicated: Secondary | ICD-10-CM | POA: Insufficient documentation

## 2017-03-01 DIAGNOSIS — Z833 Family history of diabetes mellitus: Secondary | ICD-10-CM | POA: Insufficient documentation

## 2017-03-01 DIAGNOSIS — Z114 Encounter for screening for human immunodeficiency virus [HIV]: Secondary | ICD-10-CM

## 2017-03-01 DIAGNOSIS — Z811 Family history of alcohol abuse and dependence: Secondary | ICD-10-CM | POA: Diagnosis not present

## 2017-03-01 MED ORDER — MELOXICAM 15 MG PO TABS: 15.0000 mg | ORAL_TABLET | Freq: Every day | ORAL | 3 refills | Status: AC

## 2017-03-01 MED ORDER — DULOXETINE HCL 30 MG PO CPEP: 30.0000 mg | ORAL_CAPSULE | Freq: Every day | ORAL | 3 refills | Status: AC

## 2017-03-01 MED ORDER — TIZANIDINE HCL 4 MG PO TABS: 4.0000 mg | ORAL_TABLET | Freq: Every day | ORAL | 0 refills | Status: AC

## 2017-03-01 NOTE — Patient Instructions (Addendum)
Vaccination today  Urine test today  Lab tests today  Try to stop smoking  Need old records PCP in [place]   Steps to Quit Smoking Smoking tobacco can be bad for your health. It can also affect almost every organ in your body. Smoking puts you and people around you at risk for many serious long-lasting (chronic) diseases. Quitting smoking is hard, but it is one of the best things that you can do for your health. It is never too late to quit. What are the benefits of quitting smoking? When you quit smoking, you lower your risk for getting serious diseases and conditions. They can include:  Lung cancer or lung disease.  Heart disease.  Stroke.  Heart attack.  Not being able to have children (infertility).  Weak bones (osteoporosis) and broken bones (fractures).  If you have coughing, wheezing, and shortness of breath, those symptoms may get better when you quit. You may also get sick less often. If you are pregnant, quitting smoking can help to lower your chances of having a baby of low birth weight. What can I do to help me quit smoking? Talk with your doctor about what can help you quit smoking. Some things you can do (strategies) include:  Quitting smoking totally, instead of slowly cutting back how much you smoke over a period of time.  Going to in-person counseling. You are more likely to quit if you go to many counseling sessions.  Using resources and support systems, such as: ? Agricultural engineernline chats with a Veterinary surgeoncounselor. ? Phone quitlines. ? Automotive engineerrinted self-help materials. ? Support groups or group counseling. ? Text messaging programs. ? Mobile phone apps or applications.  Taking medicines. Some of these medicines may have nicotine in them. If you are pregnant or breastfeeding, do not take any medicines to quit smoking unless your doctor says it is okay. Talk with your doctor about counseling or other things that can help you.  Talk with your doctor about using more than one strategy  at the same time, such as taking medicines while you are also going to in-person counseling. This can help make quitting easier. What things can I do to make it easier to quit? Quitting smoking might feel very hard at first, but there is a lot that you can do to make it easier. Take these steps:  Talk to your family and friends. Ask them to support and encourage you.  Call phone quitlines, reach out to support groups, or work with a Veterinary surgeoncounselor.  Ask people who smoke to not smoke around you.  Avoid places that make you want (trigger) to smoke, such as: ? Bars. ? Parties. ? Smoke-break areas at work.  Spend time with people who do not smoke.  Lower the stress in your life. Stress can make you want to smoke. Try these things to help your stress: ? Getting regular exercise. ? Deep-breathing exercises. ? Yoga. ? Meditating. ? Doing a body scan. To do this, close your eyes, focus on one area of your body at a time from head to toe, and notice which parts of your body are tense. Try to relax the muscles in those areas.  Download or buy apps on your mobile phone or tablet that can help you stick to your quit plan. There are many free apps, such as QuitGuide from the [employer] developer(Centers for Disease Control and Prevention). You can find more support from smokefree.gov and other websites.  This information is not intended to replace advice given  to you by your health care provider. Make sure you discuss any questions you have with your health care provider. Document Released: 02/21/2009 Document Revised: 12/24/2015 Document Reviewed: 09/11/2014 Elsevier Interactive Patient Education  2018 [employer].

## 2017-03-01 NOTE — Progress Notes (Signed)
Chief Complaint  Patient presents with  . Follow-up    est care  Patient has recently moved to the area and would like to establish care.  He was seen a primary care doctor in [place].  He tells me he was under this doctor's care for depression and anxiety, chronic low back pain.  He is unable to tell me his medications.  I did review his current chart, with visits back in 2012 and 2016.  It does not appear he has any other ongoing medical problems. I have discussed the multiple health risks associated with cigarette smoking including, but not limited to, cardiovascular disease, lung disease and cancer.  I have strongly recommended that smoking be stopped.  I have reviewed the various methods of quitting including cold [place]key, classes, nicotine replacements and prescription medications.  I have offered assistance in this difficult process.  The patient is interested in assistance at this time.  He would like to attend smoking classes.  He is going to obtain nicotine patches. 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  He states he has had it all of his life.  He states he is unable to retain new information.  He can read and states that we can send him written information. He previously drank heavily.  He was diagnosed as having alcoholism.  He quit drinking about 20 years ago. He currently has a driver's license but does not drive.  He has to pay off a ticket. He tells me that he had a girlfriend in [place] that left him.  He suspects she had a sexually transmitted disease.  He has no symptoms at this time but would like to be tested prior to engaging in a new relationship.  I did explain to him the importance of safe sex/condoms He agrees to immunization updates.  We gave him a flu and Pneumovax today.  He likely needs a Tdap.  I will obtain his old records.   Patient Active Problem List   Diagnosis Date Noted  . Tobacco abuse 03/01/2017  . Neck pain  01/23/2011  . Back pain, chronic 01/23/2011  . Depression 01/23/2011  . Alcoholism in remission [facility](HCC) 01/23/2011    Outpatient Encounter Prescriptions as of 03/01/2017  Medication Sig  . DULoxetine (CYMBALTA) 30 MG capsule Take 1 capsule (30 mg total) by mouth daily.  . meloxicam (MOBIC) 15 MG tablet Take 1 tablet (15 mg total) by mouth daily. With food  . tiZANidine (ZANAFLEX) 4 MG tablet Take 1 tablet (4 mg total) by mouth at bedtime. As needed muscle relaxer   No facility-administered encounter medications on file as of 03/01/2017.     Past Medical History:  Diagnosis Date  . ADHD (attention deficit hyperactivity disorder)   . Alcohol abuse    alcohol  . Anxiety   . Chronic pain   . Depression   . Insomnia     Past Surgical History:  Procedure Laterality Date  . left ear     tube    Social History   Social History  . Marital status: Single    Spouse name: N/A  . Number of children: 2  . Years of education: 11   Occupational History  . disabled    Social History Main Topics  . Smoking status: Current Every Day Smoker    Packs/day: 0.50    Types: Cigarettes    Start date: 05/11/1988  . Smokeless tobacco: Never Used  . Alcohol use  No  . Drug use: No  . Sexual activity: Yes   Other Topics Concern  . Not on file   Social History Narrative   Disabled due to learning disability   Lives with mother   Brother and his wife   Brothers mother in Social worker also   Pet dogs ( chi)    Family History  Problem Relation Age of Onset  . Diabetes Mother   . Arthritis Mother   . Depression Mother   . Hypertension Mother   . Alcohol abuse Father   . Depression Father   . Cirrhosis Father   . Heart disease Son     Review of Systems  Constitutional: Negative for chills, fever and weight loss.  HENT: Negative for congestion and hearing loss.        Edentulous  Eyes: Negative for blurred vision and pain.       Recent eye visit  Respiratory: Negative for cough and  shortness of breath.   Cardiovascular: Negative for chest pain and leg swelling.  Gastrointestinal: Negative for abdominal pain, constipation, diarrhea and heartburn.  Genitourinary: Negative for dysuria and frequency.  Musculoskeletal: Negative for falls, joint pain and myalgias.  Neurological: Negative for dizziness, seizures and headaches.  Psychiatric/Behavioral: Positive for memory loss. Negative for depression. The patient is nervous/anxious. The patient does not have insomnia.        Patient states easily upset    BP 110/68 (BP Location: Right Arm, Patient Position: Sitting, Cuff Size: Large)   Pulse 76   Temp 98.5 F (36.9 C) (Temporal)   Resp 16   Ht 6\' 2"  (1.88 m)   Wt 205 lb 0.6 oz (93 kg)   SpO2 96%   BMI 26.33 kg/m   Physical Exam  Constitutional: He is oriented to person, place, and time. He appears well-developed and well-nourished.  Smells of tobacco  HENT:  Head: Normocephalic and atraumatic.  Mouth/Throat: Oropharynx is clear and moist.  Eyes: Pupils are equal, round, and reactive to light. Conjunctivae are normal.  Neck: Normal range of motion. Neck supple. No thyromegaly present.  Cardiovascular: Normal rate, regular rhythm and normal heart sounds.   Pulmonary/Chest: Effort normal and breath sounds normal. No respiratory distress.  Abdominal: Soft. Bowel sounds are normal.  Musculoskeletal: Normal range of motion. He exhibits no edema.  Lymphadenopathy:    He has no cervical adenopathy.  Neurological: He is alert and oriented to person, place, and time.  Gait normal  Skin: Skin is warm and dry.  Psychiatric: He has a normal mood and affect.  Slow responses  Nursing note and vitals reviewed.  .ASSESSMENT/PLAN:  1. Screening for HIV (human immunodeficiency virus) - HIV antibody (with reflex)  2. Tobacco abuse - CBC - COMPLETE METABOLIC PANEL WITH GFR - Urinalysis, Routine w reflex microscopic - Lipid panel  3. Chronic midline low back pain  without sciatica  4. Possible exposure to STD - Urine cytology ancillary only  5. Dysthymia  6. Needs flu shot - Flu Vaccine QUAD 36+ mos IM  7. Family history of high cholesterol - Lipid panel  8. Need for pneumococcal vaccination - Pneumococcal conjugate vaccine 13-valent IM   Patient Instructions  Vaccination today  Urine test today  Lab tests today  Try to stop smoking  Need old records PCP in [place]   Steps to Quit Smoking Smoking tobacco can be bad for your health. It can also affect almost every organ in your body. Smoking puts you and people around you  at risk for many serious long-lasting (chronic) diseases. Quitting smoking is hard, but it is one of the best things that you can do for your health. It is never too late to quit. What are the benefits of quitting smoking? When you quit smoking, you lower your risk for getting serious diseases and conditions. They can include:  Lung cancer or lung disease.  Heart disease.  Stroke.  Heart attack.  Not being able to have children (infertility).  Weak bones (osteoporosis) and broken bones (fractures).  If you have coughing, wheezing, and shortness of breath, those symptoms may get better when you quit. You may also get sick less often. If you are pregnant, quitting smoking can help to lower your chances of having a baby of low birth weight. What can I do to help me quit smoking? Talk with your doctor about what can help you quit smoking. Some things you can do (strategies) include:  Quitting smoking totally, instead of slowly cutting back how much you smoke over a period of time.  Going to in-person counseling. You are more likely to quit if you go to many counseling sessions.  Using resources and support systems, such as: ? Agricultural engineer with a Veterinary surgeon. ? Phone quitlines. ? Automotive engineer. ? Support groups or group counseling. ? Text messaging programs. ? Mobile phone apps or  applications.  Taking medicines. Some of these medicines may have nicotine in them. If you are pregnant or breastfeeding, do not take any medicines to quit smoking unless your doctor says it is okay. Talk with your doctor about counseling or other things that can help you.  Talk with your doctor about using more than one strategy at the same time, such as taking medicines while you are also going to in-person counseling. This can help make quitting easier. What things can I do to make it easier to quit? Quitting smoking might feel very hard at first, but there is a lot that you can do to make it easier. Take these steps:  Talk to your family and friends. Ask them to support and encourage you.  Call phone quitlines, reach out to support groups, or work with a Veterinary surgeon.  Ask people who smoke to not smoke around you.  Avoid places that make you want (trigger) to smoke, such as: ? Bars. ? Parties. ? Smoke-break areas at work.  Spend time with people who do not smoke.  Lower the stress in your life. Stress can make you want to smoke. Try these things to help your stress: ? Getting regular exercise. ? Deep-breathing exercises. ? Yoga. ? Meditating. ? Doing a body scan. To do this, close your eyes, focus on one area of your body at a time from head to toe, and notice which parts of your body are tense. Try to relax the muscles in those areas.  Download or buy apps on your mobile phone or tablet that can help you stick to your quit plan. There are many free apps, such as QuitGuide from the [employer] developer for Disease Control and Prevention). You can find more support from smokefree.gov and other websites.  This information is not intended to replace advice given to you by your health care provider. Make sure you discuss any questions you have with your health care provider. Document Released: 02/21/2009 Document Revised: 12/24/2015 Document Reviewed: 09/11/2014 Elsevier Interactive Patient  Education  2018 Elsevier Inc.    Eustace Moore, MD

## 2017-03-02 ENCOUNTER — Encounter: Payer: Self-pay | Admitting: Family Medicine

## 2017-03-02 DIAGNOSIS — E786 Lipoprotein deficiency: Secondary | ICD-10-CM | POA: Insufficient documentation

## 2017-03-02 LAB — LIPID PANEL
Cholesterol: 91 mg/dL (ref <200)
HDL: 27 mg/dL — AB (ref >40)
LDL Cholesterol (Calc): 47 mg/dL (calc)
Non-HDL Cholesterol (Calc): 64 mg/dL (calc) (ref <130)
TRIGLYCERIDES: 86 mg/dL (ref <150)
Total CHOL/HDL Ratio: 3.4 (calc) (ref <5.0)

## 2017-03-02 LAB — COMPLETE METABOLIC PANEL WITH GFR
AG Ratio: 1.4 (calc) (ref 1.0–2.5)
ALT: 13 U/L (ref 9–46)
AST: 12 U/L (ref 10–40)
Albumin: 3.8 g/dL (ref 3.6–5.1)
Alkaline phosphatase (APISO): 99 U/L (ref 40–115)
BILIRUBIN TOTAL: 0.4 mg/dL (ref 0.2–1.2)
BUN: 8 mg/dL (ref 7–25)
CHLORIDE: 104 mmol/L (ref 98–110)
CO2: 29 mmol/L (ref 20–32)
Calcium: 8.8 mg/dL (ref 8.6–10.3)
Creat: 0.87 mg/dL (ref 0.60–1.35)
GFR, EST AFRICAN AMERICAN: 121 mL/min/{1.73_m2} (ref >=60)
GFR, Est Non African American: 104 mL/min/{1.73_m2} (ref >=60)
GLUCOSE: 93 mg/dL (ref 65–139)
Globulin: 2.8 g/dL (calc) (ref 1.9–3.7)
POTASSIUM: 4 mmol/L (ref 3.5–5.3)
Sodium: 138 mmol/L (ref 135–146)
TOTAL PROTEIN: 6.6 g/dL (ref 6.1–8.1)

## 2017-03-02 LAB — CBC
HCT: 47.4 % (ref 38.5–50.0)
Hemoglobin: 16.9 g/dL (ref 13.2–17.1)
MCH: 32.8 pg (ref 27.0–33.0)
MCHC: 35.7 g/dL (ref 32.0–36.0)
MCV: 91.9 fL (ref 80.0–100.0)
MPV: 9.9 fL (ref 7.5–12.5)
Platelets: 210 10*3/uL (ref 140–400)
RBC: 5.16 10*6/uL (ref 4.20–5.80)
RDW: 12 % (ref 11.0–15.0)
WBC: 6.1 10*3/uL (ref 3.8–10.8)

## 2017-03-02 LAB — URINALYSIS, ROUTINE W REFLEX MICROSCOPIC
Bilirubin Urine: NEGATIVE
GLUCOSE, UA: NEGATIVE
Hgb urine dipstick: NEGATIVE
Ketones, ur: NEGATIVE
LEUKOCYTES UA: NEGATIVE
Nitrite: NEGATIVE
PROTEIN: NEGATIVE
Specific Gravity, Urine: 1.017 (ref 1.001–1.03)
pH: 6 (ref 5.0–8.0)

## 2017-03-02 LAB — HIV ANTIBODY (ROUTINE TESTING W REFLEX): HIV 1&2 Ab, 4th Generation: NONREACTIVE

## 2017-03-03 LAB — URINE CYTOLOGY ANCILLARY ONLY
CHLAMYDIA, DNA PROBE: NEGATIVE
Neisseria Gonorrhea: NEGATIVE

## 2017-03-04 ENCOUNTER — Ambulatory Visit: Payer: Medicare Other | Admitting: Family Medicine

## 2017-03-04 ENCOUNTER — Telehealth: Payer: Self-pay

## 2017-03-04 NOTE — Telephone Encounter (Signed)
All negative.  YSN

## 2017-03-04 NOTE — Telephone Encounter (Signed)
Pt called looking for STD results

## 2017-03-05 ENCOUNTER — Encounter: Payer: Self-pay | Admitting: Family Medicine

## 2017-03-05 NOTE — Telephone Encounter (Signed)
Left message to call office

## 2017-03-16 ENCOUNTER — Ambulatory Visit: Payer: Medicare Other | Admitting: Family Medicine

## 2017-04-20 ENCOUNTER — Ambulatory Visit: Payer: Medicare Other | Admitting: Family Medicine

## 2017-04-30 ENCOUNTER — Telehealth: Payer: Self-pay | Admitting: Family Medicine

## 2017-04-30 NOTE — Telephone Encounter (Signed)
Pt left a msg for us to return his call at [phone] as he missed his appt, I called and no answer no voicemail

## 2017-05-21 ENCOUNTER — Ambulatory Visit: Payer: Medicare Other | Admitting: Family Medicine

## 2023-03-18 DIAGNOSIS — Z720 Tobacco use: Secondary | ICD-10-CM | POA: Diagnosis not present

## 2023-03-18 DIAGNOSIS — L03114 Cellulitis of left upper limb: Secondary | ICD-10-CM | POA: Diagnosis not present

## 2023-03-22 DIAGNOSIS — L02414 Cutaneous abscess of left upper limb: Secondary | ICD-10-CM | POA: Diagnosis not present

## 2023-05-10 DIAGNOSIS — M25522 Pain in left elbow: Secondary | ICD-10-CM | POA: Diagnosis not present

## 2023-05-10 DIAGNOSIS — M7032 Other bursitis of elbow, left elbow: Secondary | ICD-10-CM | POA: Diagnosis not present

## 2023-05-11 DIAGNOSIS — H1789 Other corneal scars and opacities: Secondary | ICD-10-CM | POA: Diagnosis not present

## 2023-05-11 DIAGNOSIS — H5213 Myopia, bilateral: Secondary | ICD-10-CM | POA: Diagnosis not present

## 2023-05-11 DIAGNOSIS — H52213 Irregular astigmatism, bilateral: Secondary | ICD-10-CM | POA: Diagnosis not present

## 2023-05-11 DIAGNOSIS — H47293 Other optic atrophy, bilateral: Secondary | ICD-10-CM | POA: Diagnosis not present

## 2023-05-11 DIAGNOSIS — H47093 Other disorders of optic nerve, not elsewhere classified, bilateral: Secondary | ICD-10-CM | POA: Diagnosis not present
